# Patient Record
Sex: Female | Born: 1975 | Race: White | Hispanic: No | Marital: Married | Smoking: Never smoker
Health system: Southern US, Community
[De-identification: ages and names within clinical notes are randomized; demographics above are authoritative.]

## PROBLEM LIST (undated history)

## (undated) DIAGNOSIS — K219 Gastro-esophageal reflux disease without esophagitis: Secondary | ICD-10-CM

## (undated) DIAGNOSIS — E041 Nontoxic single thyroid nodule: Secondary | ICD-10-CM

## (undated) DIAGNOSIS — E049 Nontoxic goiter, unspecified: Secondary | ICD-10-CM

## (undated) HISTORY — DX: Gastro-esophageal reflux disease without esophagitis: K21.9

## (undated) HISTORY — PX: TUBAL LIGATION: SHX77

## (undated) HISTORY — DX: Nontoxic single thyroid nodule: E04.1

---

## 2004-01-09 ENCOUNTER — Other Ambulatory Visit: Admission: RE | Admit: 2004-01-09 | Discharge: 2004-01-09 | Payer: Self-pay | Admitting: Family Medicine

## 2005-01-16 ENCOUNTER — Other Ambulatory Visit: Admission: RE | Admit: 2005-01-16 | Discharge: 2005-01-16 | Payer: Self-pay | Admitting: Family Medicine

## 2006-01-20 ENCOUNTER — Other Ambulatory Visit: Admission: RE | Admit: 2006-01-20 | Discharge: 2006-01-20 | Payer: Self-pay | Admitting: Family Medicine

## 2007-03-03 ENCOUNTER — Other Ambulatory Visit: Admission: RE | Admit: 2007-03-03 | Discharge: 2007-03-03 | Payer: Self-pay | Admitting: Family Medicine

## 2007-09-30 ENCOUNTER — Encounter: Payer: Self-pay | Admitting: Endocrinology

## 2008-01-05 ENCOUNTER — Encounter: Payer: Self-pay | Admitting: Endocrinology

## 2008-01-06 ENCOUNTER — Encounter: Payer: Self-pay | Admitting: Endocrinology

## 2008-01-18 ENCOUNTER — Encounter: Payer: Self-pay | Admitting: Endocrinology

## 2008-01-28 DIAGNOSIS — E041 Nontoxic single thyroid nodule: Secondary | ICD-10-CM

## 2008-01-28 HISTORY — DX: Nontoxic single thyroid nodule: E04.1

## 2008-01-31 ENCOUNTER — Ambulatory Visit: Payer: Self-pay | Admitting: Endocrinology

## 2008-01-31 DIAGNOSIS — K219 Gastro-esophageal reflux disease without esophagitis: Secondary | ICD-10-CM | POA: Insufficient documentation

## 2008-01-31 HISTORY — DX: Gastro-esophageal reflux disease without esophagitis: K21.9

## 2008-04-19 ENCOUNTER — Other Ambulatory Visit: Admission: RE | Admit: 2008-04-19 | Discharge: 2008-04-19 | Payer: Self-pay | Admitting: Family Medicine

## 2009-04-30 ENCOUNTER — Other Ambulatory Visit: Admission: RE | Admit: 2009-04-30 | Discharge: 2009-04-30 | Payer: Self-pay | Admitting: Family Medicine

## 2009-04-30 ENCOUNTER — Encounter: Payer: Self-pay | Admitting: Endocrinology

## 2009-04-30 LAB — CONVERTED CEMR LAB

## 2009-08-13 ENCOUNTER — Ambulatory Visit: Payer: Self-pay | Admitting: Endocrinology

## 2009-08-20 ENCOUNTER — Encounter: Admission: RE | Admit: 2009-08-20 | Discharge: 2009-08-20 | Payer: Self-pay | Admitting: Endocrinology

## 2010-08-21 NOTE — Letter (Signed)
Summary: [facility]  [facility]   Imported By: Lester Pottsgrove 08/20/2009 10:55:18  _____________________________________________________________________  External Attachment:    Type:   Image     Comment:   External Document

## 2010-08-21 NOTE — Assessment & Plan Note (Signed)
Summary: FU--PER PT MD REQ'D A FU APPT  STC   Vital Signs:  Patient profile:   35 year old female Height:      64 inches (162.56 cm) Weight:      144 pounds (65.45 kg) BMI:     24.81 O2 Sat:      98 % on Room air Temp:     98.1 degrees F (36.72 degrees C) oral Pulse rate:   90 / minute BP sitting:   108 / 60  (left arm) Cuff size:   regular  Vitals Entered By: Josph Macho CMA (August 13, 2009 2:38 PM)  O2 Flow:  Room air CC: Follow-up visit/ CF Is Patient Diabetic? No   Referring Provider:  Ewing Schlein Primary Provider:  Katrinka Blazing  CC:  Follow-up visit/ CF.  History of Present Illness: pt says she still notices the right thyroid mass.  she feels it has increased in size.    Current Medications (verified): 1)  Zyrtec Allergy 10 Mg  Tabs (Cetirizine Hcl) .... Take 1 By Mouth Qd 2)  Acyclovir 400 Mg  Tabs (Acyclovir) .... Take 1 By Mouth Qd 3)  Extra Strength Pain Reliever 500 Mg  Tabs (Acetaminophen) .... Take 1-2 By Mouth Once Daily Prn 4)  Tums 500 Mg  Chew (Calcium Carbonate Antacid) .... Chew 1-2 By Mouth Qd 5)  Acid Reducer 75 Mg  Tabs (Ranitidine Hcl) .... Take Prn 6)  Prenatal Vitamin .Marland Kitchen.. 1 Tab By Mouth Once Daily 7)  Maalox .... As Needed  Allergies (verified): No Known Drug Allergies  Past History:  Past Medical History: Last updated: 01/31/2008 GERD (ICD-530.81) THYROID NODULE (ICD-241.0)  Physical Exam  General:  normal appearance.   Neck:  3 cm right thyroid mass Additional Exam:  ([facility])  tsh recenty normal   Impression & Recommendations:  Problem # 1:  THYROID NODULE (ICD-241.0)  Medications Added to Medication List This Visit: 1)  Prenatal Vitamin  .Marland Kitchen.. 1 tab by mouth once daily 2)  Maalox  .... As needed  Other Orders: Radiology Referral (Radiology) Est. Patient Level III ([address]109)  Patient Instructions: 1)  recheck thyroid ultrasound. 2)  we discussed the possibility of repeating the aspiration, or referral to surgery,  depending on the extent of growth over the past 19 months. 3)  return 1 year if little or no change on the ultrasound.  Preventive Care Screening  Pap Smear:    Date:  04/30/2009    Results:  historical

## 2011-03-17 ENCOUNTER — Other Ambulatory Visit (INDEPENDENT_AMBULATORY_CARE_PROVIDER_SITE_OTHER): Payer: Commercial Managed Care - PPO

## 2011-03-17 ENCOUNTER — Ambulatory Visit (INDEPENDENT_AMBULATORY_CARE_PROVIDER_SITE_OTHER): Payer: Commercial Managed Care - PPO | Admitting: Endocrinology

## 2011-03-17 ENCOUNTER — Encounter: Payer: Self-pay | Admitting: Endocrinology

## 2011-03-17 VITALS — BP 102/70 | HR 69 | Temp 98.6°F | Ht 64.0 in | Wt 157.6 lb

## 2011-03-17 DIAGNOSIS — E042 Nontoxic multinodular goiter: Secondary | ICD-10-CM

## 2011-03-17 NOTE — Progress Notes (Signed)
  Subjective:    Patient ID: Bridget Massey, female    DOB: 1976/05/23, 35 y.o.   MRN: [id number]  HPI Pt returns for f/u of complex right thyroid nodule.  bx was low-risk in 2009. She says she can see the nodule in the mirror, but says it does not bother her.  She last had tft done during her pregnancy last year.  She is not 6 mos postpartum.  She stopped breast-feeding 1 month ago.  Past Medical History  Diagnosis Date  . THYROID NODULE 01/28/2008  . GERD 01/31/2008    No past surgical history on file.  History   Social History  . Marital Status: Married    Spouse Name: N/A    Number of Children: N/A  . Years of Education: N/A   Occupational History  . Nurse [employer]   Social History Main Topics  . Smoking status: Never Smoker   . Smokeless tobacco: Not on file  . Alcohol Use: Not on file  . Drug Use: Not on file  . Sexually Active: Not on file   Other Topics Concern  . Not on file   Social History Narrative  . No narrative on file    Current Outpatient Prescriptions on File Prior to Visit  Medication Sig Dispense Refill  . acyclovir (ZOVIRAX) 400 MG tablet Take 400 mg by mouth daily as needed.       Marland Kitchen aluminum & magnesium hydroxide (MAALOX) 225-200 MG/5ML suspension Take by mouth as needed.        Marland Kitchen PRENATAL VITAMINS PO Take 1 tablet by mouth daily.         No Known Allergies  Family History  Problem Relation Age of Onset  . Thyroid disease Neg Hx    BP 102/70  Pulse 69  Temp(Src) 98.6 F (37 C) (Oral)  Ht 5\' 4"  (1.626 m)  Wt 157 lb 9.6 oz (71.487 kg)  BMI 27.05 kg/m2  SpO2 97%  Review of Systems Denies dysphagia    Objective:   Physical Exam VITAL SIGNS:  See vs page GENERAL: no distress Neck: 3-4 cm right thyroid nodule, firm.   Lab Results  Component Value Date   TSH 0.83 03/17/2011   (i reviewed 2011 thyroid ultrasound).     Assessment & Plan:  Hypothyroidism, well-replaced.

## 2011-03-17 NOTE — Patient Instructions (Addendum)
A thyroid blood test are being requested for you today.  please call [phone] to hear your test results.  You will be prompted to enter the 9-digit "MRN" number that appears at the top left of this page, followed by #.  Then you will hear the message. Please return in 1 year most of the time, a "lumpy thyroid" will eventually become overactive.  this is usually a slow process, happening over the span of many years. Cc dr white at [facility] ob[facility]

## 2013-04-19 ENCOUNTER — Ambulatory Visit: Payer: Commercial Managed Care - PPO | Admitting: Endocrinology

## 2013-05-12 ENCOUNTER — Telehealth: Payer: Self-pay | Admitting: Endocrinology

## 2013-05-12 NOTE — Telephone Encounter (Signed)
Please ask your pcp to make this referral

## 2013-05-12 NOTE — Telephone Encounter (Signed)
CB# [id number], wants to transfer from our practice to [facility], our office is too far to drive. Says she needs a referral. Pt would like a call back today please / Sherri

## 2013-05-12 NOTE — Telephone Encounter (Signed)
No answer.. on home #

## 2013-05-13 NOTE — Telephone Encounter (Signed)
Left message

## 2013-06-06 ENCOUNTER — Ambulatory Visit: Payer: Commercial Managed Care - PPO | Admitting: Endocrinology

## 2021-09-08 ENCOUNTER — Other Ambulatory Visit: Payer: Self-pay

## 2021-09-08 ENCOUNTER — Emergency Department (HOSPITAL_BASED_OUTPATIENT_CLINIC_OR_DEPARTMENT_OTHER): Payer: PRIVATE HEALTH INSURANCE

## 2021-09-08 ENCOUNTER — Emergency Department (HOSPITAL_BASED_OUTPATIENT_CLINIC_OR_DEPARTMENT_OTHER)
Admission: EM | Admit: 2021-09-08 | Discharge: 2021-09-08 | Disposition: A | Discharge status: Home / Self Care | Payer: PRIVATE HEALTH INSURANCE | Attending: Emergency Medicine | Admitting: Emergency Medicine

## 2021-09-08 ENCOUNTER — Encounter (HOSPITAL_BASED_OUTPATIENT_CLINIC_OR_DEPARTMENT_OTHER): Payer: Self-pay | Admitting: Emergency Medicine

## 2021-09-08 DIAGNOSIS — F419 Anxiety disorder, unspecified: Secondary | ICD-10-CM | POA: Diagnosis not present

## 2021-09-08 DIAGNOSIS — I10 Essential (primary) hypertension: Secondary | ICD-10-CM | POA: Diagnosis not present

## 2021-09-08 DIAGNOSIS — R002 Palpitations: Secondary | ICD-10-CM | POA: Insufficient documentation

## 2021-09-08 DIAGNOSIS — K219 Gastro-esophageal reflux disease without esophagitis: Secondary | ICD-10-CM | POA: Diagnosis not present

## 2021-09-08 DIAGNOSIS — R000 Tachycardia, unspecified: Secondary | ICD-10-CM | POA: Insufficient documentation

## 2021-09-08 DIAGNOSIS — R0602 Shortness of breath: Secondary | ICD-10-CM | POA: Insufficient documentation

## 2021-09-08 DIAGNOSIS — E119 Type 2 diabetes mellitus without complications: Secondary | ICD-10-CM | POA: Diagnosis not present

## 2021-09-08 DIAGNOSIS — R079 Chest pain, unspecified: Secondary | ICD-10-CM

## 2021-09-08 HISTORY — DX: Nontoxic goiter, unspecified: E04.9

## 2021-09-08 LAB — BASIC METABOLIC PANEL
Anion gap: 7 (ref 5–15)
BUN: 8 mg/dL (ref 6–20)
CO2: 25 mmol/L (ref 22–32)
Calcium: 9.6 mg/dL (ref 8.9–10.3)
Chloride: 102 mmol/L (ref 98–111)
Creatinine, Ser: 0.57 mg/dL (ref 0.44–1.00)
GFR, Estimated: >60 mL/min (ref >60)
Glucose, Bld: 89 mg/dL (ref 70–99)
Potassium: 4 mmol/L (ref 3.5–5.1)
Sodium: 134 mmol/L — ABNORMAL LOW (ref 135–145)

## 2021-09-08 LAB — CBC
HCT: 38.9 % (ref 36.0–46.0)
Hemoglobin: 13.3 g/dL (ref 12.0–15.0)
MCH: 32.4 pg (ref 26.0–34.0)
MCHC: 34.2 g/dL (ref 30.0–36.0)
MCV: 94.9 fL (ref 80.0–100.0)
Platelets: 272 10*3/uL (ref 150–400)
RBC: 4.1 MIL/uL (ref 3.87–5.11)
RDW: 12.3 % (ref 11.5–15.5)
WBC: 9.7 10*3/uL (ref 4.0–10.5)
nRBC: 0 % (ref 0.0–0.2)

## 2021-09-08 LAB — TROPONIN I (HIGH SENSITIVITY)
Troponin I (High Sensitivity): <2 ng/L (ref <18)
Troponin I (High Sensitivity): <2 ng/L (ref <18)

## 2021-09-08 LAB — PREGNANCY, URINE: Preg Test, Ur: NEGATIVE

## 2021-09-08 MED ORDER — OMEPRAZOLE 20 MG PO CPDR: 20.0000 mg | DELAYED_RELEASE_CAPSULE | Freq: Every day | ORAL | 0 refills | Status: AC

## 2021-09-08 NOTE — ED Triage Notes (Signed)
Pt c/o CP and palpitations intermittently since last pm

## 2021-09-08 NOTE — Discharge Instructions (Addendum)
As we discussed your symptoms appear consistent with acid reflux, versus esophageal spasm.  Recommend that you take omeprazole, or Protonix once daily to see if it helps with your symptoms.  You may want to follow-up with a gastroenterologist if the symptoms continue.  Additionally your report of palpitations could be indicative of some kind of heart arrhythmia, we do not see any evidence of any arrhythmia during your visit in the emergency department today, however they may want to put you on a cardiac event monitor for a week to a month if you do speak to cardiology.  If you have worsening chest pain, shortness of breath, feeling like you are going to pass out I recommend return to the emergency department for further evaluation.  It was a pleasure taking care of you today I hope that you feel better soon.

## 2021-09-08 NOTE — ED Provider Notes (Signed)
[name] EM[facility]ENC[facility] DEPARTMENT Provider Note   CSN: [id number] Arrival date & time: 09/08/21  1407     History  Chief Complaint  Patient presents with   Chest Pain    Bridget Massey is a 46 y.o. female with past medical history significant for acid reflux who presents with chest pain, palpitations intermittently since last night.  Patient reports that she has had a history of similar in the past and wore a Holter monitor for 2 days with no significant results.  Patient reports that she was sitting on the couch, felt a burning sensation in the chest, checked her watch and noted that she had tachycardia up to 115-120.  Reports that she did not necessarily have a sensation of chest pain more of a warmth,/tightness.  Patient reports that she felt slightly short of breath with it.  At this time she is still having intermittent episodes of similar reports that they come on spasmodically only last for a few moments and then improved.  Patient denies personal history of ACS, hypertension, high cholesterol, diabetes, stroke.  She denies family history of ACS.  Patient does report that she has some anxiety, although this feels dissimilar from her normal anxiety.   Chest Pain Associated symptoms: palpitations       Home Medications Prior to Admission medications   Medication Sig Start Date End Date Taking? Authorizing Provider  omeprazole (PRILOSEC) 20 MG capsule Take 1 capsule (20 mg total) by mouth daily. 09/08/21  Yes Kadince Boxley H, PA-C  acyclovir (ZOVIRAX) 400 MG tablet Take 400 mg by mouth daily as needed.     [provider]  aluminum & magnesium hydroxide (MAALOX) 225-200 MG/5ML suspension Take by mouth as needed.      [provider]  PRENATAL VITAMINS PO Take 1 tablet by mouth daily.      [provider]      Allergies    Duloxetine and Iodinated contrast media    Review of Systems   Review of Systems  Cardiovascular:  Positive for  chest pain and palpitations.  All other systems reviewed and are negative.  Physical Exam Updated Vital Signs BP 101/69    Pulse 72    Temp 98.1 F (36.7 C) (Oral)    Resp 14    Ht 5' (1.524 m)    Wt 65.8 kg    SpO2 100%    BMI 28.32 kg/m  Physical Exam Vitals and nursing note reviewed.  Constitutional:      General: She is not in acute distress.    Appearance: Normal appearance.  HENT:     Head: Normocephalic and atraumatic.  Eyes:     General:        Right eye: No discharge.        Left eye: No discharge.  Cardiovascular:     Rate and Rhythm: Normal rate and regular rhythm.     Heart sounds: No murmur heard.   No friction rub. No gallop.  Pulmonary:     Effort: Pulmonary effort is normal.     Breath sounds: Normal breath sounds.  Chest:     Chest wall: No tenderness.     Comments: No significant tenderness to palpation of the chest wall Abdominal:     General: Bowel sounds are normal.     Palpations: Abdomen is soft.     Comments: No tenderness to palpation in the epigastric abdominal region.  Normal bowel sounds throughout.  Skin:    General:  Skin is warm and dry.     Capillary Refill: Capillary refill takes less than 2 seconds.  Neurological:     Mental Status: She is alert and oriented to person, place, and time.  Psychiatric:        Mood and Affect: Mood normal.        Behavior: Behavior normal.    ED Results / Procedures / Treatments   Labs (all labs ordered are listed, but only abnormal results are displayed) Labs Reviewed  BASIC METABOLIC PANEL - Abnormal; Notable for the following components:      Result Value   Sodium 134 (*)    All other components within normal limits  CBC  PREGNANCY, URINE  TROPONIN I (HIGH SENSITIVITY)  TROPONIN I (HIGH SENSITIVITY)    EKG EKG Interpretation  Date/Time:  Sunday September 08 2021 14:20:42 EST Ventricular Rate:  83 PR Interval:  138 QRS Duration: 86 QT Interval:  342 QTC Calculation: 401 R Axis:   89 Text  Interpretation: Normal sinus rhythm with sinus arrhythmia Nonspecific T wave abnormality Abnormal ECG No previous ECGs available Confirmed by Wandra Arthurs [id number]) on 09/08/2021 5:18:20 PM  Radiology DG Chest 2 View  Result Date: 09/08/2021 CLINICAL DATA:  Chest pain EXAM: CHEST - 2 VIEW COMPARISON:  Chest x-ray 11/26/2020 FINDINGS: Heart size and mediastinal contours are within normal limits. No suspicious pulmonary opacities identified. No pleural effusion or pneumothorax visualized. No acute osseous abnormality appreciated. IMPRESSION: No acute intrathoracic process identified. Electronically Signed   By: Ofilia Neas M.D.   On: 09/08/2021 14:47    Procedures Procedures    Medications Ordered in ED Medications - No data to display  ED Course/ Medical Decision Making/ A&P                           Medical Decision Making Amount and/or Complexity of Data Reviewed Labs: ordered. Radiology: ordered.   Given the large differential diagnosis for Bridget Massey, the decision making in this case is of high complexity.  After evaluating all of the data points in this case, the presentation of Bridget Massey is NOT consistent with Acute Coronary Syndrome (ACS) and/or myocardial ischemia, pulmonary embolism, aortic dissection; Bridget Massey, significant arrythmia, pneumothorax, cardiac tamponade, or other emergent cardiopulmonary condition.  Further, the presentation of Bridget Massey is NOT consistent with pericarditis, myocarditis, cholecystitis, pancreatitis, mediastinitis, endocarditis, new valvular disease.  Additionally, the presentation of Bridget Massey is NOT consistent with flail chest, cardiac contusion, ARDS, or significant intra-thoracic or intra-abdominal bleeding.  Moreover, this presentation is NOT consistent with pneumonia, sepsis, or pyelonephritis.  The patient has a heart score of 1.  A story that does not sound consistent with ACS chest pain.  She has  negative troponin x2.  She has otherwise unremarkable lab work.  Her EKG which was independently reviewed By my attending Dr. Darl Householder shows nonspecific T wave changes, no evidence of ischemia.  I personally reviewed her radiographic imaging of the chest which showed no acute intrathoracic abnormality.  Overall patient gestalt appears consistent with acid reflux, esophageal spasm, with possible arrhythmia.   Strict return and follow-up precautions have been given by me personally or by detailed written instruction given verbally by nursing staff using the teach back method to the patient/family/caregiver(s).  Data Reviewed/Counseling: I have reviewed the patient's vital signs, nursing notes, and other relevant tests/information. I had a detailed discussion regarding the historical points, exam findings, and any diagnostic results supporting the  discharge diagnosis. I also discussed the need for outpatient follow-up and the need to return to the ED if symptoms worsen or if there are any questions or concerns that arise at home.  Final Clinical Impression(s) / ED Diagnoses Final diagnoses:  Chest pain, unspecified type  Gastroesophageal reflux disease, unspecified whether esophagitis present    Rx / DC Orders ED Discharge Orders          Ordered    omeprazole (PRILOSEC) 20 MG capsule  Daily        09/08/21 1839              Anselmo Pickler, PA-C 09/08/21 1841    Drenda Freeze, MD 09/09/21 [phone]

## 2023-02-10 IMAGING — CR DG CHEST 2V
2 series · 2 of 2 positions shown · non-contrast
Comparison: Chest x-ray 11/26/2020

CLINICAL DATA: Chest pain

EXAM:
CHEST - 2 VIEW

[w chest pa]
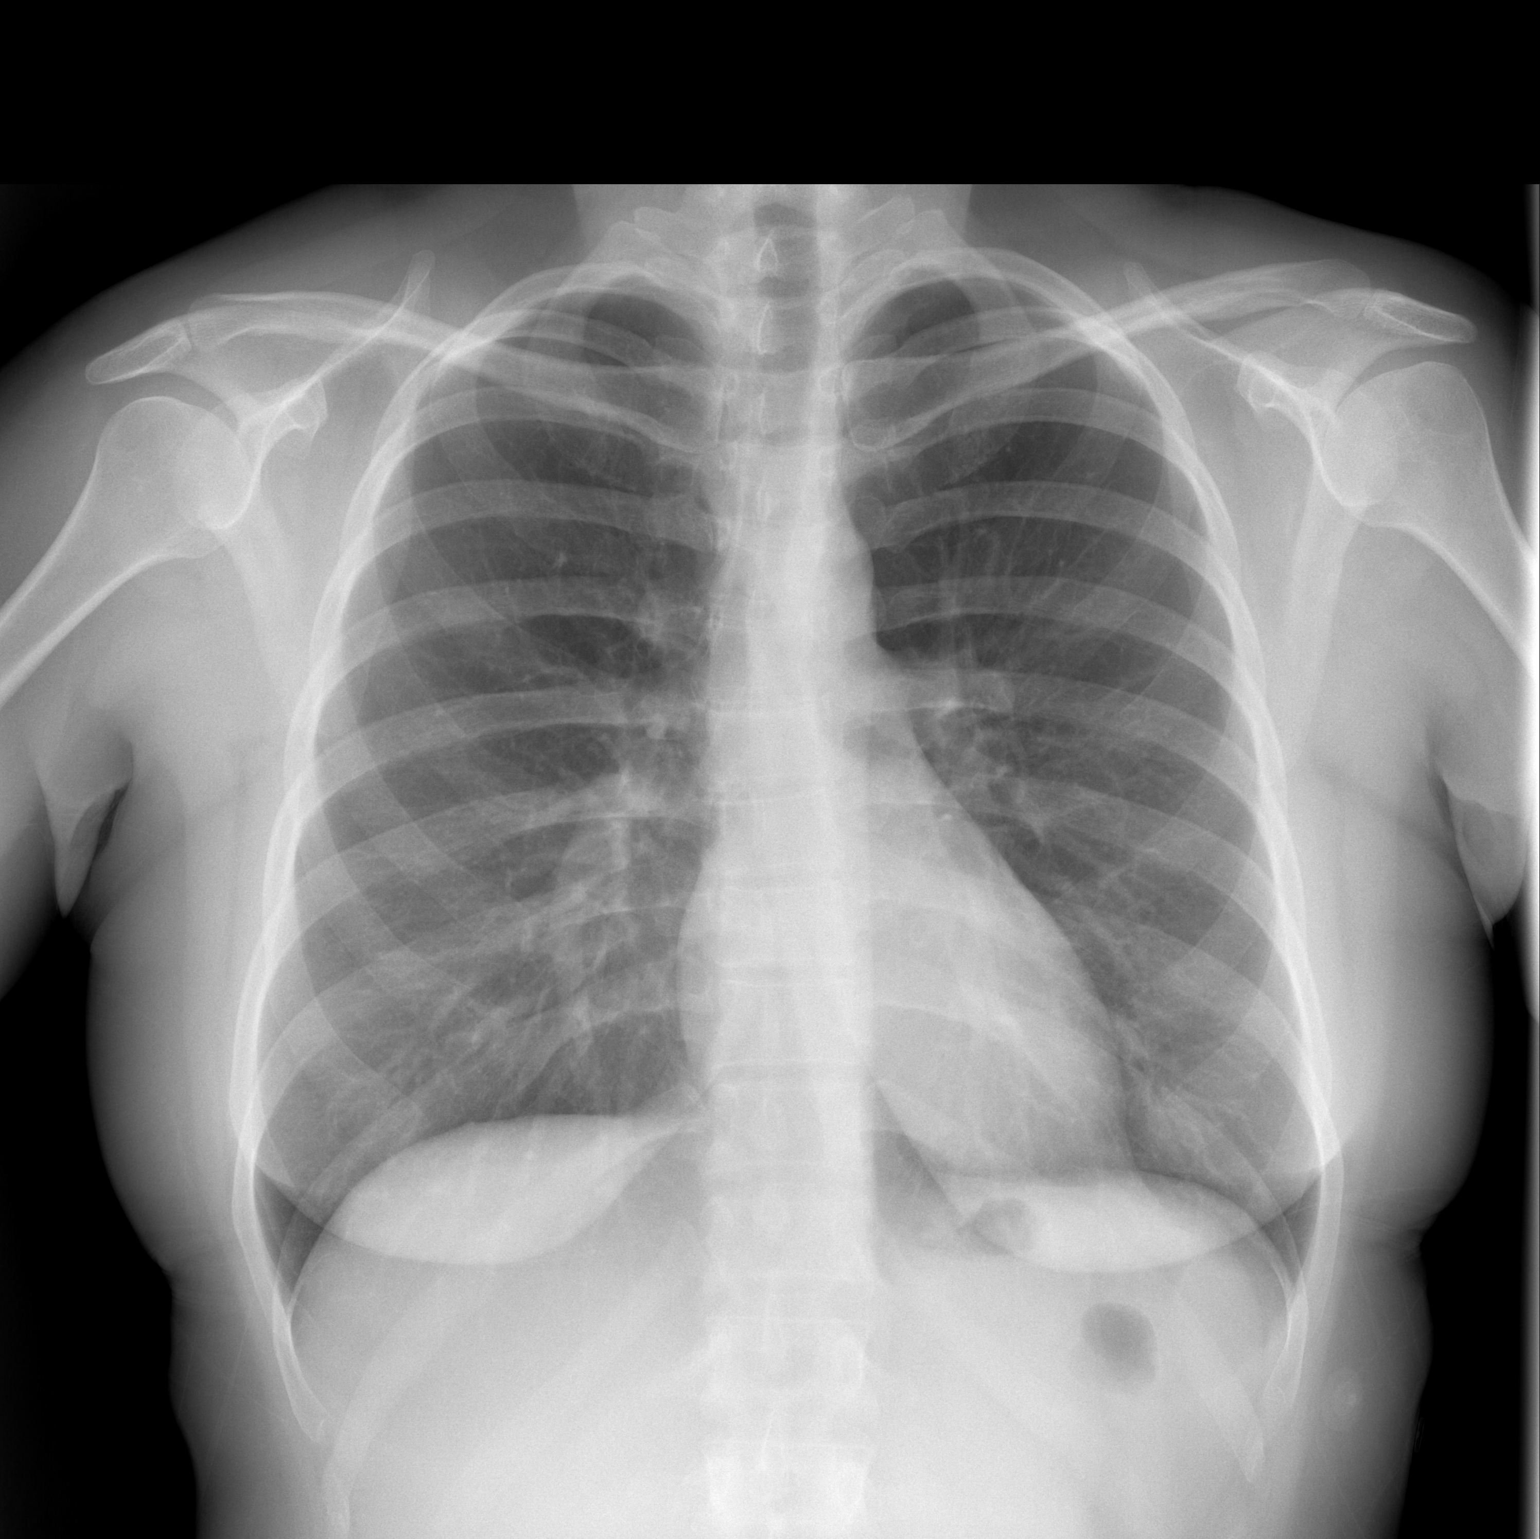

[w chest lat]
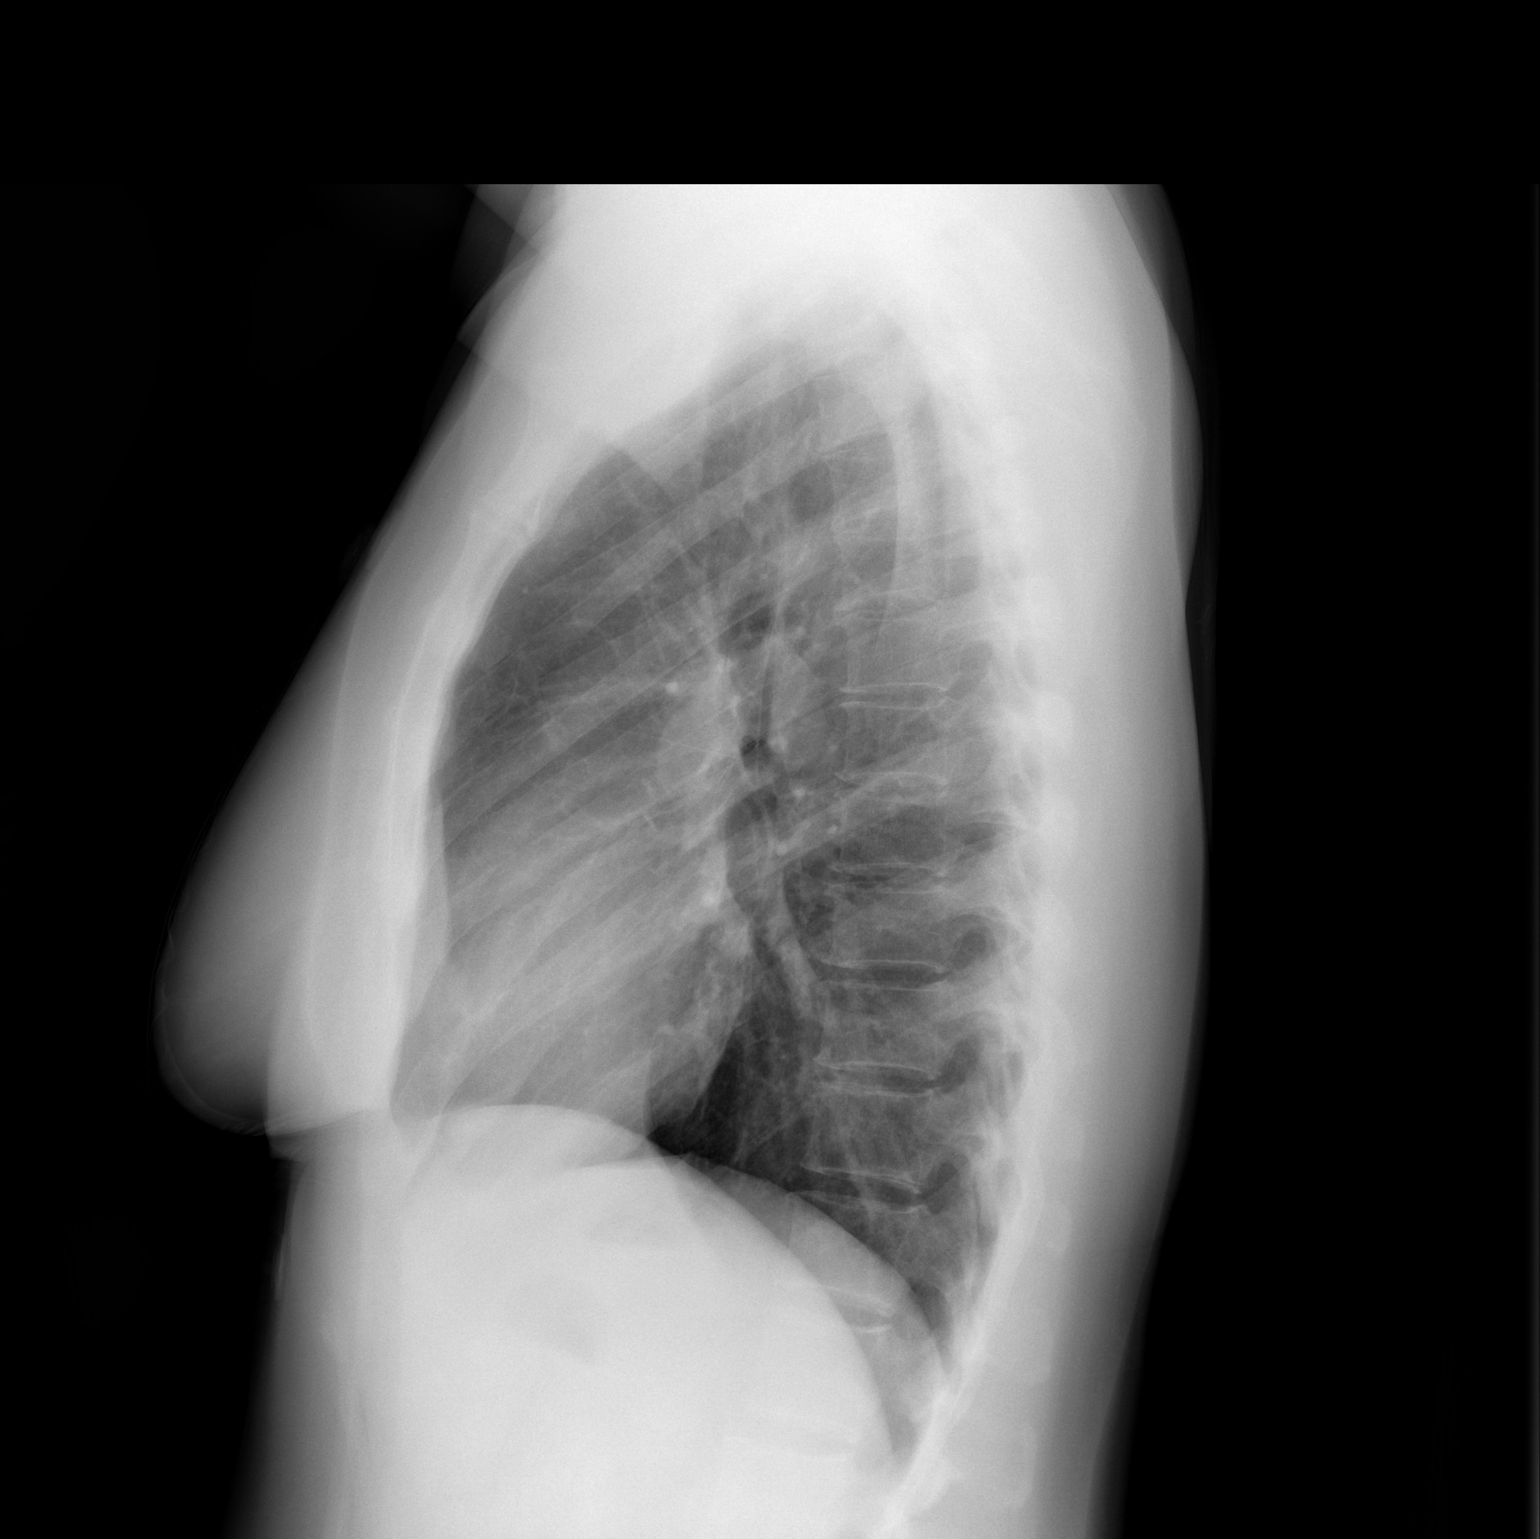

[2 of 2 positions shown; findings below may reference images not displayed]

FINDINGS: Heart size and mediastinal contours are within normal limits. No
suspicious pulmonary opacities identified.

No pleural effusion or pneumothorax visualized.

No acute osseous abnormality appreciated.
IMPRESSION: No acute intrathoracic process identified.
